# Patient Record
Sex: Male | Born: 1960 | Race: White | Hispanic: No | Smoking: Former smoker
Health system: Southern US, Community
[De-identification: ages and names within clinical notes are randomized; demographics above are authoritative.]

## PROBLEM LIST (undated history)

## (undated) DIAGNOSIS — I10 Essential (primary) hypertension: Secondary | ICD-10-CM

## (undated) DIAGNOSIS — E119 Type 2 diabetes mellitus without complications: Secondary | ICD-10-CM

## (undated) HISTORY — PX: NO PAST SURGERIES: SHX2092

---

## 2012-12-13 ENCOUNTER — Ambulatory Visit: Payer: Self-pay | Admitting: Orthopedic Surgery

## 2012-12-20 ENCOUNTER — Other Ambulatory Visit: Payer: Self-pay | Admitting: Neurosurgery

## 2012-12-24 ENCOUNTER — Encounter (HOSPITAL_COMMUNITY)
Admission: RE | Admit: 2012-12-24 | Discharge: 2012-12-24 | Disposition: A | Discharge status: Home / Self Care | Payer: 59 | Source: Ambulatory Visit | Attending: Anesthesiology | Admitting: Anesthesiology

## 2012-12-24 ENCOUNTER — Encounter (HOSPITAL_COMMUNITY)
Admission: RE | Admit: 2012-12-24 | Discharge: 2012-12-24 | Disposition: A | Discharge status: Home / Self Care | Payer: 59 | Source: Ambulatory Visit | Attending: Neurosurgery | Admitting: Neurosurgery

## 2012-12-24 ENCOUNTER — Encounter (HOSPITAL_COMMUNITY): Payer: Self-pay

## 2012-12-24 DIAGNOSIS — E119 Type 2 diabetes mellitus without complications: Secondary | ICD-10-CM | POA: Insufficient documentation

## 2012-12-24 DIAGNOSIS — Z01818 Encounter for other preprocedural examination: Secondary | ICD-10-CM | POA: Insufficient documentation

## 2012-12-24 DIAGNOSIS — I10 Essential (primary) hypertension: Secondary | ICD-10-CM | POA: Insufficient documentation

## 2012-12-24 DIAGNOSIS — Z01812 Encounter for preprocedural laboratory examination: Secondary | ICD-10-CM | POA: Insufficient documentation

## 2012-12-24 DIAGNOSIS — Z01810 Encounter for preprocedural cardiovascular examination: Secondary | ICD-10-CM | POA: Insufficient documentation

## 2012-12-24 HISTORY — DX: Essential (primary) hypertension: I10

## 2012-12-24 HISTORY — DX: Type 2 diabetes mellitus without complications: E11.9

## 2012-12-24 LAB — CBC
HCT: 45.8 % (ref 39.0–52.0)
Hemoglobin: 17 g/dL (ref 13.0–17.0)
MCHC: 37.1 g/dL — ABNORMAL HIGH (ref 30.0–36.0)
MCV: 84.2 fL (ref 78.0–100.0)
RDW: 13.4 % (ref 11.5–15.5)
WBC: 12.8 10*3/uL — ABNORMAL HIGH (ref 4.0–10.5)

## 2012-12-24 LAB — BASIC METABOLIC PANEL
BUN: 12 mg/dL (ref 6–23)
CO2: 28 mEq/L (ref 19–32)
Chloride: 96 mEq/L (ref 96–112)
Creatinine, Ser: 0.61 mg/dL (ref 0.50–1.35)
Potassium: 4 mEq/L (ref 3.5–5.1)

## 2012-12-24 MED ORDER — DEXTROSE 5 % IV SOLN: 2.0000 g | INTRAVENOUS | Status: DC

## 2012-12-24 MED ORDER — DEXTROSE 5 % IV SOLN: 2.0000 g | INTRAVENOUS | Status: AC

## 2012-12-24 NOTE — Pre-Procedure Instructions (Addendum)
Jonathan Henson  12/24/2012   Your procedure is scheduled on:  Wednesday, May 7th.  Report to [facility] at 6:30 PM.  Call this number if you have problems the morning of surgery: [phone]   Remember:   Do not eat food or drink liquids after midnight.   Take these medicines the morning of surgery with A SIP OF WATER: - None   Do not wear jewelry, make-up or nail polish.  Do not wear lotions, powders, or perfumes. You may wear deodorant.   Men may shave face and neck.  Do not bring valuables to the hospital.  Contacts, dentures or bridgework may not be worn into surgery.  Leave suitcase in the car. After surgery it may be brought to your room.  For patients admitted to the hospital, checkout time is 11:00 AM the day of discharge.   Patients discharged the day of surgery will not be allowed to drive home.  Name and phone number of your driver: -   Special Instructions: Shower using CHG 2 nights before surgery and the night before surgery.  If you shower the day of surgery use CHG.  Use special wash - you have one bottle of CHG for all showers.  You should use approximately 1/3 of the bottle for each shower.   Please read over the following fact sheets that you were given: Pain Booklet, Coughing and Deep Breathing and Surgical Site Infection Prevention

## 2012-12-24 NOTE — Pre-Procedure Instructions (Addendum)
Jonathan Henson  12/24/2012   Your procedure is scheduled on:  Friday, May 9th.  Report to [facility] at 12:30 PM.  Call this number if you have problems the morning of surgery: [phone]   Remember:   Do not eat food or drink liquids after midnight.   Take these medicines the morning of surgery with A SIP OF WATER: -   Do not wear jewelry, make-up or nail polish.  Do not wear lotions, powders, or perfumes. You may wear deodorant.  Do not shave 48 hours prior to surgery.   Do not bring valuables to the hospital.  Contacts, dentures or bridgework may not be worn into surgery.  Leave suitcase in the car. After surgery it may be brought to your room.  For patients admitted to the hospital, checkout time is 11:00 AM the day of  discharge.   Patients discharged the day of surgery will not be allowed to drive  home.  Name and phone number of your driver: -   Special Instructions:   Please read over the following fact sheets that you were given: Pain Booklet, Coughing and Deep Breathing and Surgical Site Infection Prevention

## 2012-12-25 ENCOUNTER — Ambulatory Visit: Admission: RE | Admit: 2012-12-25 | Payer: 59 | Source: Ambulatory Visit | Admitting: Neurosurgery

## 2012-12-25 SURGERY — ANTERIOR CERVICAL DECOMPRESSION/DISCECTOMY FUSION 1 LEVEL
Anesthesia: General

## 2012-12-30 ENCOUNTER — Other Ambulatory Visit: Payer: Self-pay | Admitting: Neurosurgery

## 2013-01-07 MED ORDER — DEXAMETHASONE SODIUM PHOSPHATE 10 MG/ML IJ SOLN
10.0000 mg | INTRAMUSCULAR | Status: AC
  Administered 2013-01-08: 10 mg via INTRAVENOUS
  Filled 2013-01-07: qty 1

## 2013-01-07 MED ORDER — MUPIROCIN 2 % EX OINT: TOPICAL_OINTMENT | Freq: Two times a day (BID) | CUTANEOUS | Status: DC

## 2013-01-08 ENCOUNTER — Ambulatory Visit (HOSPITAL_COMMUNITY): Payer: 59 | Admitting: Anesthesiology

## 2013-01-08 ENCOUNTER — Ambulatory Visit (HOSPITAL_COMMUNITY): Payer: 59

## 2013-01-08 ENCOUNTER — Encounter (HOSPITAL_COMMUNITY): Payer: Self-pay | Admitting: Anesthesiology

## 2013-01-08 ENCOUNTER — Encounter (HOSPITAL_COMMUNITY)
Admission: RE | Disposition: A | Discharge status: Home / Self Care | Payer: Self-pay | Source: Ambulatory Visit | Attending: Neurosurgery

## 2013-01-08 ENCOUNTER — Ambulatory Visit (HOSPITAL_COMMUNITY)
Admission: RE | Admit: 2013-01-08 | Discharge: 2013-01-09 | Disposition: A | Discharge status: Home / Self Care | Payer: 59 | Source: Ambulatory Visit | Attending: Neurosurgery | Admitting: Neurosurgery

## 2013-01-08 DIAGNOSIS — I10 Essential (primary) hypertension: Secondary | ICD-10-CM | POA: Insufficient documentation

## 2013-01-08 DIAGNOSIS — E119 Type 2 diabetes mellitus without complications: Secondary | ICD-10-CM | POA: Insufficient documentation

## 2013-01-08 DIAGNOSIS — M5020 Other cervical disc displacement, unspecified cervical region: Secondary | ICD-10-CM | POA: Insufficient documentation

## 2013-01-08 HISTORY — PX: ANTERIOR CERVICAL DECOMP/DISCECTOMY FUSION: SHX1161

## 2013-01-08 LAB — CBC
Hemoglobin: 18.1 g/dL — ABNORMAL HIGH (ref 13.0–17.0)
MCV: 86.7 fL (ref 78.0–100.0)
Platelets: 196 10*3/uL (ref 150–400)
RBC: 5.65 MIL/uL (ref 4.22–5.81)
WBC: 8.6 10*3/uL (ref 4.0–10.5)

## 2013-01-08 LAB — BASIC METABOLIC PANEL
CO2: 26 mEq/L (ref 19–32)
Chloride: 96 mEq/L (ref 96–112)
Creatinine, Ser: 0.71 mg/dL (ref 0.50–1.35)
Glucose, Bld: 195 mg/dL — ABNORMAL HIGH (ref 70–99)
Sodium: 137 mEq/L (ref 135–145)

## 2013-01-08 LAB — GLUCOSE, CAPILLARY
Glucose-Capillary: 170 mg/dL — ABNORMAL HIGH (ref 70–99)
Glucose-Capillary: 203 mg/dL — ABNORMAL HIGH (ref 70–99)
Glucose-Capillary: 274 mg/dL — ABNORMAL HIGH (ref 70–99)

## 2013-01-08 SURGERY — ANTERIOR CERVICAL DECOMPRESSION/DISCECTOMY FUSION 1 LEVEL
Anesthesia: General | Site: Neck | Wound class: Clean

## 2013-01-08 MED ORDER — MENTHOL 3 MG MT LOZG: 1.0000 | LOZENGE | OROMUCOSAL | Status: DC | PRN

## 2013-01-08 MED ORDER — THROMBIN 5000 UNITS EX SOLR
CUTANEOUS | Status: DC | PRN
  Administered 2013-01-08 (×3): 5000 [IU] via TOPICAL

## 2013-01-08 MED ORDER — PHENYLEPHRINE HCL 10 MG/ML IJ SOLN
INTRAMUSCULAR | Status: DC | PRN
  Administered 2013-01-08 (×7): 80 ug via INTRAVENOUS

## 2013-01-08 MED ORDER — MIDAZOLAM HCL 5 MG/5ML IJ SOLN
INTRAMUSCULAR | Status: DC | PRN
  Administered 2013-01-08: 2 mg via INTRAVENOUS

## 2013-01-08 MED ORDER — LIDOCAINE HCL 4 % MT SOLN
OROMUCOSAL | Status: DC | PRN
  Administered 2013-01-08: 4 mL via TOPICAL

## 2013-01-08 MED ORDER — SODIUM CHLORIDE 0.9 % IJ SOLN
3.0000 mL | Freq: Two times a day (BID) | INTRAMUSCULAR | Status: DC
  Administered 2013-01-08: 3 mL via INTRAVENOUS

## 2013-01-08 MED ORDER — CEFAZOLIN SODIUM 1-5 GM-% IV SOLN
1.0000 g | Freq: Three times a day (TID) | INTRAVENOUS | Status: AC
  Administered 2013-01-08 – 2013-01-09 (×2): 1 g via INTRAVENOUS
  Filled 2013-01-08 (×2): qty 50

## 2013-01-08 MED ORDER — SODIUM CHLORIDE 0.9 % IJ SOLN: 3.0000 mL | INTRAMUSCULAR | Status: DC | PRN

## 2013-01-08 MED ORDER — PHENYLEPHRINE HCL 10 MG/ML IJ SOLN
10.0000 mg | INTRAVENOUS | Status: DC | PRN
  Administered 2013-01-08: 25 ug/min via INTRAVENOUS

## 2013-01-08 MED ORDER — LISINOPRIL 20 MG PO TABS
20.0000 mg | ORAL_TABLET | Freq: Every day | ORAL | Status: DC
  Administered 2013-01-08: 20 mg via ORAL
  Filled 2013-01-08 (×2): qty 1

## 2013-01-08 MED ORDER — PROPOFOL 10 MG/ML IV BOLUS
INTRAVENOUS | Status: DC | PRN
  Administered 2013-01-08: 190 mg via INTRAVENOUS

## 2013-01-08 MED ORDER — GLYCOPYRROLATE 0.2 MG/ML IJ SOLN
INTRAMUSCULAR | Status: DC | PRN
  Administered 2013-01-08: 0.4 mg via INTRAVENOUS

## 2013-01-08 MED ORDER — NEOSTIGMINE METHYLSULFATE 1 MG/ML IJ SOLN
INTRAMUSCULAR | Status: DC | PRN
  Administered 2013-01-08: 3 mg via INTRAVENOUS

## 2013-01-08 MED ORDER — ONDANSETRON HCL 4 MG/2ML IJ SOLN: 4.0000 mg | INTRAMUSCULAR | Status: DC | PRN

## 2013-01-08 MED ORDER — DEXAMETHASONE SODIUM PHOSPHATE 10 MG/ML IJ SOLN: 10.0000 mg | INTRAMUSCULAR | Status: DC

## 2013-01-08 MED ORDER — PHENOL 1.4 % MT LIQD
1.0000 | OROMUCOSAL | Status: DC | PRN
  Administered 2013-01-08: 1 via OROMUCOSAL
  Filled 2013-01-08: qty 177

## 2013-01-08 MED ORDER — OXYCODONE-ACETAMINOPHEN 5-325 MG PO TABS
1.0000 | ORAL_TABLET | ORAL | Status: DC | PRN
  Administered 2013-01-08: 2 via ORAL
  Administered 2013-01-08 (×2): 1 via ORAL
  Administered 2013-01-09: 2 via ORAL
  Filled 2013-01-08 (×2): qty 2

## 2013-01-08 MED ORDER — FENTANYL CITRATE 0.05 MG/ML IJ SOLN
INTRAMUSCULAR | Status: DC | PRN
  Administered 2013-01-08: 50 ug via INTRAVENOUS
  Administered 2013-01-08: 100 ug via INTRAVENOUS
  Administered 2013-01-08 (×2): 50 ug via INTRAVENOUS

## 2013-01-08 MED ORDER — HYDROMORPHONE HCL PF 1 MG/ML IJ SOLN
0.2500 mg | INTRAMUSCULAR | Status: DC | PRN
  Administered 2013-01-08: 0.5 mg via INTRAVENOUS

## 2013-01-08 MED ORDER — LACTATED RINGERS IV SOLN
INTRAVENOUS | Status: DC | PRN
  Administered 2013-01-08 (×2): via INTRAVENOUS

## 2013-01-08 MED ORDER — ACETAMINOPHEN 650 MG RE SUPP: 650.0000 mg | RECTAL | Status: DC | PRN

## 2013-01-08 MED ORDER — HEMOSTATIC AGENTS (NO CHARGE) OPTIME
TOPICAL | Status: DC | PRN
  Administered 2013-01-08: 1 via TOPICAL

## 2013-01-08 MED ORDER — SODIUM CHLORIDE 0.9 % IR SOLN
Status: DC | PRN
  Administered 2013-01-08: 13:00:00

## 2013-01-08 MED ORDER — OXYCODONE-ACETAMINOPHEN 5-325 MG PO TABS
ORAL_TABLET | ORAL | Status: AC
  Filled 2013-01-08: qty 2

## 2013-01-08 MED ORDER — ONDANSETRON HCL 4 MG/2ML IJ SOLN: 4.0000 mg | Freq: Four times a day (QID) | INTRAMUSCULAR | Status: DC | PRN

## 2013-01-08 MED ORDER — CEFAZOLIN SODIUM-DEXTROSE 2-3 GM-% IV SOLR
2.0000 g | INTRAVENOUS | Status: AC
  Administered 2013-01-08: 2 g via INTRAVENOUS

## 2013-01-08 MED ORDER — ROCURONIUM BROMIDE 100 MG/10ML IV SOLN
INTRAVENOUS | Status: DC | PRN
  Administered 2013-01-08: 50 mg via INTRAVENOUS

## 2013-01-08 MED ORDER — GLIMEPIRIDE 1 MG PO TABS
1.0000 mg | ORAL_TABLET | Freq: Every day | ORAL | Status: DC
  Administered 2013-01-09: 1 mg via ORAL
  Filled 2013-01-08 (×2): qty 1

## 2013-01-08 MED ORDER — LIDOCAINE HCL (CARDIAC) 20 MG/ML IV SOLN
INTRAVENOUS | Status: DC | PRN
  Administered 2013-01-08: 60 mg via INTRAVENOUS

## 2013-01-08 MED ORDER — ESMOLOL HCL 10 MG/ML IV SOLN
INTRAVENOUS | Status: DC | PRN
  Administered 2013-01-08: 20 mg via INTRAVENOUS

## 2013-01-08 MED ORDER — SODIUM CHLORIDE 0.9 % IV SOLN
INTRAVENOUS | Status: AC
  Filled 2013-01-08: qty 500

## 2013-01-08 MED ORDER — BACITRACIN 50000 UNITS IM SOLR
INTRAMUSCULAR | Status: AC
  Filled 2013-01-08: qty 1

## 2013-01-08 MED ORDER — 0.9 % SODIUM CHLORIDE (POUR BTL) OPTIME
TOPICAL | Status: DC | PRN
  Administered 2013-01-08: 1000 mL

## 2013-01-08 MED ORDER — CYCLOBENZAPRINE HCL 10 MG PO TABS
ORAL_TABLET | ORAL | Status: AC
  Filled 2013-01-08: qty 1

## 2013-01-08 MED ORDER — HYDROMORPHONE HCL PF 1 MG/ML IJ SOLN
INTRAMUSCULAR | Status: AC
  Filled 2013-01-08: qty 1

## 2013-01-08 MED ORDER — METFORMIN HCL 500 MG PO TABS
1000.0000 mg | ORAL_TABLET | Freq: Every day | ORAL | Status: DC
  Administered 2013-01-08: 1000 mg via ORAL
  Filled 2013-01-08 (×2): qty 2

## 2013-01-08 MED ORDER — ONDANSETRON HCL 4 MG/2ML IJ SOLN
INTRAMUSCULAR | Status: DC | PRN
  Administered 2013-01-08: 4 mg via INTRAVENOUS

## 2013-01-08 MED ORDER — ACETAMINOPHEN 325 MG PO TABS: 650.0000 mg | ORAL_TABLET | ORAL | Status: DC | PRN

## 2013-01-08 MED ORDER — METFORMIN HCL 500 MG PO TABS
1000.0000 mg | ORAL_TABLET | Freq: Every day | ORAL | Status: DC
  Filled 2013-01-08: qty 2

## 2013-01-08 MED ORDER — STERILE WATER FOR IRRIGATION IR SOLN
Status: DC | PRN
  Administered 2013-01-08: 1000 mL

## 2013-01-08 MED ORDER — CEFAZOLIN SODIUM-DEXTROSE 2-3 GM-% IV SOLR
INTRAVENOUS | Status: AC
  Filled 2013-01-08: qty 50

## 2013-01-08 MED ORDER — OXYCODONE HCL 5 MG PO TABS: 5.0000 mg | ORAL_TABLET | Freq: Once | ORAL | Status: DC | PRN

## 2013-01-08 MED ORDER — CYCLOBENZAPRINE HCL 10 MG PO TABS
10.0000 mg | ORAL_TABLET | Freq: Three times a day (TID) | ORAL | Status: DC | PRN
  Administered 2013-01-08: 10 mg via ORAL

## 2013-01-08 MED ORDER — HYDROMORPHONE HCL PF 1 MG/ML IJ SOLN: 0.5000 mg | INTRAMUSCULAR | Status: DC | PRN

## 2013-01-08 MED ORDER — OXYCODONE HCL 5 MG/5ML PO SOLN: 5.0000 mg | Freq: Once | ORAL | Status: DC | PRN

## 2013-01-08 MED ORDER — ACETAMINOPHEN 500 MG PO TABS: 500.0000 mg | ORAL_TABLET | Freq: Four times a day (QID) | ORAL | Status: DC | PRN

## 2013-01-08 SURGICAL SUPPLY — 54 items
BAG DECANTER FOR FLEXI CONT (MISCELLANEOUS) ×2 IMPLANT
BENZOIN TINCTURE PRP APPL 2/3 (GAUZE/BANDAGES/DRESSINGS) ×2 IMPLANT
BRUSH SCRUB EZ PLAIN DRY (MISCELLANEOUS) ×2 IMPLANT
BUR MATCHSTICK NEURO 3.0 LAGG (BURR) ×2 IMPLANT
CANISTER SUCTION 2500CC (MISCELLANEOUS) ×2 IMPLANT
CLOTH BEACON ORANGE TIMEOUT ST (SAFETY) ×2 IMPLANT
CONT SPEC 4OZ CLIKSEAL STRL BL (MISCELLANEOUS) ×2 IMPLANT
DERMABOND ADVANCED (GAUZE/BANDAGES/DRESSINGS) ×1
DERMABOND ADVANCED .7 DNX12 (GAUZE/BANDAGES/DRESSINGS) ×1 IMPLANT
DRAPE C-ARM 42X72 X-RAY (DRAPES) ×4 IMPLANT
DRAPE LAPAROTOMY 100X72 PEDS (DRAPES) ×2 IMPLANT
DRAPE MICROSCOPE ZEISS OPMI (DRAPES) ×2 IMPLANT
DRAPE POUCH INSTRU U-SHP 10X18 (DRAPES) ×2 IMPLANT
DRSG OPSITE 4X5.5 SM (GAUZE/BANDAGES/DRESSINGS) ×2 IMPLANT
DURAPREP 6ML APPLICATOR 50/CS (WOUND CARE) ×2 IMPLANT
ELECT COATED BLADE 2.86 ST (ELECTRODE) ×2 IMPLANT
ELECT REM PT RETURN 9FT ADLT (ELECTROSURGICAL) ×2
ELECTRODE REM PT RTRN 9FT ADLT (ELECTROSURGICAL) ×1 IMPLANT
GAUZE SPONGE 4X4 16PLY XRAY LF (GAUZE/BANDAGES/DRESSINGS) IMPLANT
GLOVE BIO SURGEON STRL SZ8 (GLOVE) ×2 IMPLANT
GLOVE EXAM NITRILE LRG STRL (GLOVE) ×2 IMPLANT
GLOVE EXAM NITRILE MD LF STRL (GLOVE) IMPLANT
GLOVE EXAM NITRILE XL STR (GLOVE) IMPLANT
GLOVE EXAM NITRILE XS STR PU (GLOVE) IMPLANT
GLOVE INDICATOR 8.5 STRL (GLOVE) ×2 IMPLANT
GOWN BRE IMP SLV AUR LG STRL (GOWN DISPOSABLE) ×2 IMPLANT
GOWN BRE IMP SLV AUR XL STRL (GOWN DISPOSABLE) ×2 IMPLANT
GOWN STRL REIN 2XL LVL4 (GOWN DISPOSABLE) ×2 IMPLANT
HEAD HALTER (SOFTGOODS) ×2 IMPLANT
HEMOSTAT POWDER KIT SURGIFOAM (HEMOSTASIS) ×2 IMPLANT
KIT BASIN OR (CUSTOM PROCEDURE TRAY) ×2 IMPLANT
KIT ROOM TURNOVER OR (KITS) ×2 IMPLANT
NEEDLE HYPO 18GX1.5 BLUNT FILL (NEEDLE) ×2 IMPLANT
NEEDLE SPNL 20GX3.5 QUINCKE YW (NEEDLE) ×2 IMPLANT
NS IRRIG 1000ML POUR BTL (IV SOLUTION) ×2 IMPLANT
PACK LAMINECTOMY NEURO (CUSTOM PROCEDURE TRAY) ×2 IMPLANT
PAD ARMBOARD 7.5X6 YLW CONV (MISCELLANEOUS) ×6 IMPLANT
PLATE ANT CERV XTEND 1 LV 14 (Plate) ×2 IMPLANT
PUTTY BONE DBX 2.5 MIS (Bone Implant) ×2 IMPLANT
RUBBERBAND STERILE (MISCELLANEOUS) ×4 IMPLANT
SCREW XTD VAR 4.2 SELF TAP (Screw) ×8 IMPLANT
SPACER COLONIAL LGE 8MM 7DEG (Spacer) ×2 IMPLANT
SPONGE GAUZE 4X4 12PLY (GAUZE/BANDAGES/DRESSINGS) ×2 IMPLANT
SPONGE INTESTINAL PEANUT (DISPOSABLE) ×2 IMPLANT
SPONGE SURGIFOAM ABS GEL SZ50 (HEMOSTASIS) ×2 IMPLANT
STRIP CLOSURE SKIN 1/2X4 (GAUZE/BANDAGES/DRESSINGS) ×2 IMPLANT
SUT VIC AB 3-0 SH 8-18 (SUTURE) ×2 IMPLANT
SUT VICRYL 4-0 PS2 18IN ABS (SUTURE) ×2 IMPLANT
SYR 20ML ECCENTRIC (SYRINGE) ×2 IMPLANT
TAPE CLOTH 4X10 WHT NS (GAUZE/BANDAGES/DRESSINGS) IMPLANT
TOWEL OR 17X24 6PK STRL BLUE (TOWEL DISPOSABLE) ×2 IMPLANT
TOWEL OR 17X26 10 PK STRL BLUE (TOWEL DISPOSABLE) ×2 IMPLANT
TRAP SPECIMEN MUCOUS 40CC (MISCELLANEOUS) ×2 IMPLANT
WATER STERILE IRR 1000ML POUR (IV SOLUTION) ×2 IMPLANT

## 2013-01-08 NOTE — Anesthesia Preprocedure Evaluation (Addendum)
Anesthesia Evaluation  Patient identified by MRN, date of birth, ID band Patient awake    Reviewed: Allergy & Precautions, H&P , NPO status , Patient's Chart, lab work & pertinent test results  Airway Mallampati: II  Neck ROM: full    Dental  (+) Teeth Intact   Pulmonary former smoker,          Cardiovascular hypertension,     Neuro/Psych    GI/Hepatic   Endo/Other  diabetes, Type 2  Renal/GU      Musculoskeletal   Abdominal   Peds  Hematology   Anesthesia Other Findings   Reproductive/Obstetrics                          Anesthesia Physical Anesthesia Plan  ASA: II  Anesthesia Plan: General   Post-op Pain Management:    Induction: Intravenous  Airway Management Planned: Oral ETT  Additional Equipment:   Intra-op Plan:   Post-operative Plan: Extubation in OR  Informed Consent: I have reviewed the patients History and Physical, chart, labs and discussed the procedure including the risks, benefits and alternatives for the proposed anesthesia with the patient or authorized representative who has indicated his/her understanding and acceptance.     Plan Discussed with: CRNA, Anesthesiologist and Surgeon  Anesthesia Plan Comments:         Anesthesia Quick Evaluation

## 2013-01-08 NOTE — Preoperative (Signed)
Beta Blockers   Reason not to administer Beta Blockers:Not Applicable 

## 2013-01-08 NOTE — Anesthesia Postprocedure Evaluation (Signed)
Anesthesia Post Note  Patient: Jonathan Henson  Procedure(s) Performed: Procedure(s) (LRB): ANTERIOR CERVICAL DECOMPRESSION/DISCECTOMY FUSION 1 LEVEL Cervical four-five (N/A)  Anesthesia type: General  Patient location: PACU  Post pain: Pain level controlled and Adequate analgesia  Post assessment: Post-op Vital signs reviewed, Patient's Cardiovascular Status Stable, Respiratory Function Stable, Patent Airway and Pain level controlled  Last Vitals:  Filed Vitals:   01/08/13 1428  BP:   Pulse: 79  Temp:   Resp: 15    Post vital signs: Reviewed and stable  Level of consciousness: awake, alert  and oriented  Complications: No apparent anesthesia complications

## 2013-01-08 NOTE — Plan of Care (Signed)
Problem: Consults Goal: Diagnosis - Spinal Surgery Outcome: Completed/Met Date Met:  01/08/13 Cervical Spine Fusion

## 2013-01-08 NOTE — Anesthesia Procedure Notes (Signed)
Procedure Name: Intubation Date/Time: 01/08/2013 12:20 PM Performed by: Sharlene Dory E Pre-anesthesia Checklist: Patient identified, Emergency Drugs available, Suction available, Patient being monitored and Timeout performed Patient Re-evaluated:Patient Re-evaluated prior to inductionOxygen Delivery Method: Circle system utilized Preoxygenation: Pre-oxygenation with 100% oxygen Intubation Type: IV induction Ventilation: Mask ventilation without difficulty Laryngoscope Size: Mac and 3 Grade View: Grade II Tube type: Oral Tube size: 7.5 mm Number of attempts: 1 Airway Equipment and Method: Stylet,  C-Track utilized and LTA kit utilized Placement Confirmation: ETT inserted through vocal cords under direct vision,  positive ETCO2 and breath sounds checked- equal and bilateral Secured at: 22 cm Tube secured with: Tape Dental Injury: Teeth and Oropharynx as per pre-operative assessment

## 2013-01-08 NOTE — Transfer of Care (Signed)
Immediate Anesthesia Transfer of Care Note  Patient: Jonathan Henson  Procedure(s) Performed: Procedure(s): ANTERIOR CERVICAL DECOMPRESSION/DISCECTOMY FUSION 1 LEVEL Cervical four-five (N/A)  Patient Location: PACU  Anesthesia Type:General  Level of Consciousness: awake, alert  and oriented  Airway & Oxygen Therapy: Patient Spontanous Breathing and Patient connected to nasal cannula oxygen  Post-op Assessment: Report given to PACU RN, Post -op Vital signs reviewed and stable and Patient moving all extremities X 4  Post vital signs: Reviewed and stable  Complications: No apparent anesthesia complications

## 2013-01-08 NOTE — Op Note (Signed)
Preoperative diagnosis: Right C5 radiculopathy from herniated nucleus pulposus C4-5 and spinal cord injury  Postoperative diagnosis: Same  Procedure: Anterior cervical discectomy and fusion at C4-5 using globus peek cages packed with local autograft mixed with DBX and globus extend plating system 14 mm plate with 1-61 mm variable angle screws  Surgeon: Jillyn Hidden Tabithia Stroder  Assistant: Coletta Memos  Anesthesia: Gen.  EBL: Minimal  History of present illness: Patient is a very pleasant 52 year old on who's had probably worsening neck pain right shoulder pain with weakness in his right shoulder and right tricep. Workup revealed a large disc herniation at C4-5 on the right causing severe stenosis of the right C5 neural foramen as well as a spinal cord contusion. Due to patient's clinical exam and imaging findings and failure conservative treatment I recommended anterior cervical discectomy fusion at C4-5 I extensively reviewed the risks and benefits of the operation as well as perioperative course expectations of outcome alternatives of surgery he understands and agrees to proceed forward.  Operative procedure: Patient brought into the or was induced under general anesthesia positioned supine the neck in slight extension in 5 pounds of halter traction the right side his neck was prepped and draped in routine sterile fashion. Preoperative x-ray localize the appropriate level so after adequate prepping curvilinear incision was made just off midline to the interbody the sternomastoid and the superficial layer of the platysma was dissected and divided longitudinally the avascular plane to sternomastoid and strap as was developed and the previously fashioned professes acid with Kitners. Interoperative X. identify the appropriate level so annulotomy was made with a 15 blade marked the disc space and self-retaining retractors placed. The spaces further incised large anterior aspect of did not a 3 minute Kerrison punch BA  curette used to scrape the remainder the disc space out and the space is drilled down the posterior annulus and a complex using a high-speed drill capturing the bone shavings in a mucous trap the under microscopic illumination the undersurface of both endplates was aggressively under bitten the PLL was identified and removed in piecemeal fashion exposing the thecal sac there was several large hoarseness migrated subligamentous and causing severe thecal sac compression and stenosis the proximal C5 nerve root. These were teased away with I nerve hook and marking laterally both C5 pedicles were identified both C5 nerve root skeletonized decompressed. There was extensive amount of fibrosis and osteophyte causing severe stenosis of the right C5 nerve root as well as all the disc fragments were after all this is removed there is no further stenosis either foraminally or centrally the endplates were scraped and prepared into the cage the cage was inserted then a 40 mm placed all screws excellent purchase locking mechanisms were engaged additional bone graft was packed laterally on the left and the wounds closed in layers with after Vicryl and the skin was closed with a running 4 subcuticular benzoin and Steri-Strips were applied patient recovered in stable condition. At the end of case all needle counts sponge counts were correct.

## 2013-01-08 NOTE — H&P (Signed)
Jonathan Henson is an 52 y.o. male.   Chief Complaint: Neck and right shoulder and arm pain weakness HPI: Patient is a 52 year old some is a progress worsening neck pain within the pain in the right shoulder with weakness in his right shoulder has got progressively worse imaging showed cervical spondylosis with spinal cord injury with signal change within the spinal cord as well as a large disc herniation displacing the right C5 nerve root. Due to his progression of clinical syndrome weakness an exam and imaging findings I recommended anterior cervical discectomy fusion at C4-5 went over the risks and benefits of the operation with him as well as perioperative course expectations of outcome alternatives of surgery he understands and agrees to proceed forward.  Past Medical History  Diagnosis Date  . Diabetes mellitus without complication   . Hypertension     Past Surgical History  Procedure Laterality Date  . No past surgeries      No family history on file. Social History:  reports that he has quit smoking. His smoking use included Cigarettes. He smoked 0.00 packs per day for 5 years. His smokeless tobacco use includes Snuff. He reports that he does not drink alcohol or use illicit drugs.  Allergies: No Known Allergies  Medications Prior to Admission  Medication Sig Dispense Refill  . acetaminophen (TYLENOL) 500 MG tablet Take 500 mg by mouth every 6 (six) hours as needed for pain.      Marland Kitchen glimepiride (AMARYL) 1 MG tablet Take 1 mg by mouth daily before breakfast.      . lisinopril (PRINIVIL,ZESTRIL) 20 MG tablet Take 20 mg by mouth daily.      . metFORMIN (GLUCOPHAGE) 500 MG tablet Take 1,000 mg by mouth daily.        Results for orders placed during the hospital encounter of 01/08/13 (from the past 48 hour(s))  CBC     Status: Abnormal   Collection Time    01/08/13 11:03 AM      Result Value Range   WBC 8.6  4.0 - 10.5 K/uL   RBC 5.65  4.22 - 5.81 MIL/uL   Hemoglobin 18.1 (*) 13.0  - 17.0 g/dL   HCT 16.1  09.6 - 04.5 %   MCV 86.7  78.0 - 100.0 fL   MCH 32.0  26.0 - 34.0 pg   MCHC 36.9 (*) 30.0 - 36.0 g/dL   RDW 40.9  81.1 - 91.4 %   Platelets 196  150 - 400 K/uL  BASIC METABOLIC PANEL     Status: Abnormal   Collection Time    01/08/13 11:03 AM      Result Value Range   Sodium 137  135 - 145 mEq/L   Potassium 4.3  3.5 - 5.1 mEq/L   Chloride 96  96 - 112 mEq/L   CO2 26  19 - 32 mEq/L   Glucose, Bld 195 (*) 70 - 99 mg/dL   BUN 11  6 - 23 mg/dL   Creatinine, Ser 7.82  0.50 - 1.35 mg/dL   Calcium 9.9  8.4 - 95.6 mg/dL   GFR calc non Af Amer >90  >90 mL/min   GFR calc Af Amer >90  >90 mL/min   Comment:            The eGFR has been calculated     using the CKD EPI equation.     This calculation has not been     validated in all clinical  situations.     eGFR's persistently     <90 mL/min signify     possible Chronic Kidney Disease.  GLUCOSE, CAPILLARY     Status: Abnormal   Collection Time    01/08/13 11:07 AM      Result Value Range   Glucose-Capillary 170 (*) 70 - 99 mg/dL   No results found.  Review of Systems  Constitutional: Negative.   HENT: Positive for neck pain.   Eyes: Negative.   Respiratory: Negative.   Cardiovascular: Negative.   Gastrointestinal: Negative.   Genitourinary: Negative.   Musculoskeletal: Positive for myalgias.  Skin: Negative.   Neurological: Positive for tingling and sensory change.  Endo/Heme/Allergies: Negative.   Psychiatric/Behavioral: Negative.     Blood pressure 116/76, pulse 101, temperature 97.4 F (36.3 C), temperature source Oral, resp. rate 20, SpO2 97.00%. Physical Exam  Constitutional: He is oriented to person, place, and time. He appears well-developed and well-nourished.  Eyes: Pupils are equal, round, and reactive to light.  Neck: Normal range of motion.  Respiratory: Effort normal.  GI: Soft.  Neurological: He is alert and oriented to person, place, and time. GCS eye subscore is 4. GCS verbal  subscore is 5. GCS motor subscore is 6.  Reflex Scores:      Brachioradialis reflexes are 1+ on the right side and 1+ on the left side.      Patellar reflexes are 2+ on the right side and 2+ on the left side.      Achilles reflexes are 2+ on the right side and 2+ on the left side. Left upper x-rays strength is 5 out of 5 in his deltoids biceps triceps wrist flexion extension and intrinsics right upper to me strength has very weak deltoid and about 2-3/5 biceps about 4 or 5 otherwise 5 out of 5     Assessment/Plan Patient presents for an ACDF at C4-5  Jonathan Henson P 01/08/2013, 11:52 AM

## 2013-01-09 ENCOUNTER — Encounter (HOSPITAL_COMMUNITY): Payer: Self-pay | Admitting: Neurosurgery

## 2013-01-09 LAB — GLUCOSE, CAPILLARY: Glucose-Capillary: 151 mg/dL — ABNORMAL HIGH (ref 70–99)

## 2013-01-09 MED ORDER — OXYCODONE-ACETAMINOPHEN 5-325 MG PO TABS: 1.0000 | ORAL_TABLET | ORAL | Status: AC | PRN

## 2013-01-09 MED ORDER — CYCLOBENZAPRINE HCL 10 MG PO TABS: 10.0000 mg | ORAL_TABLET | Freq: Three times a day (TID) | ORAL | Status: AC | PRN

## 2013-01-09 NOTE — Progress Notes (Signed)
Pt. discharged home accompanied by mother. Prescriptions and discharge instructions given with verbalization of understanding. Incision site on neck with no s/s of infection - no swelling, redness, bleeding, and/or drainage noted. Soft collar intact. Opportunity given to ask questions but no question asked. Pt. transported out of this unit in wheelchair by the volunteer.

## 2013-01-09 NOTE — Progress Notes (Signed)
Patient ID: Jonathan Henson, male   DOB: 20-Nov-1960, 52 y.o.   MRN: [id number] Patient is doing very well has a complete resolution of his preoperative right arm radicular pain he still has weakness in his right shoulder but is improving. Neurologic exam stable deltoid is still 2-3/5 wound is clean and dry plan discharge home

## 2013-01-09 NOTE — Discharge Summary (Signed)
  Physician Discharge Summary  Patient ID: Jonathan Henson MRN: [id number] DOB/AGE: Dec 23, 1960 52 y.o.  Admit date: 01/08/2013 Discharge date: 01/09/2013  Admission Diagnoses: Right C5 radiculopathy from HNP C4-5 right  Discharge Diagnoses: Same Active Problems:   * No active hospital problems. *   Discharged Condition: good  Hospital Course: Patient was admitted hospital underwent the aforementioned procedure postoperatively patient did very well with recovered in the floor on the floor was convalescing well ambulating voiding and swallowing fine was stable to be discharged home.  Consults: Significant Diagnostic Studies: Treatments: ACDF C4-5 Discharge Exam: Blood pressure 121/81, pulse 93, temperature 98.5 F (36.9 C), temperature source Oral, resp. rate 16, SpO2 95.00%. Strength out of 5 wound clean and dry  Disposition: Home     Medication List    TAKE these medications       acetaminophen 500 MG tablet  Commonly known as:  TYLENOL  Take 500 mg by mouth every 6 (six) hours as needed for pain.     cyclobenzaprine 10 MG tablet  Commonly known as:  FLEXERIL  Take 1 tablet (10 mg total) by mouth 3 (three) times daily as needed for muscle spasms.     glimepiride 1 MG tablet  Commonly known as:  AMARYL  Take 1 mg by mouth daily before breakfast.     lisinopril 20 MG tablet  Commonly known as:  PRINIVIL,ZESTRIL  Take 20 mg by mouth daily.     metFORMIN 500 MG tablet  Commonly known as:  GLUCOPHAGE  Take 1,000 mg by mouth daily.     oxyCODONE-acetaminophen 5-325 MG per tablet  Commonly known as:  PERCOCET/ROXICET  Take 1-2 tablets by mouth every 4 (four) hours as needed.           Follow-up Information   Follow up with Lorella Gomez P, MD.   Contact information:   [address] [place] [address] [phone]       Signed: Tajae Rybicki P 01/09/2013, 7:45 AM

## 2013-06-02 ENCOUNTER — Emergency Department: Payer: Self-pay | Admitting: Emergency Medicine

## 2013-06-02 LAB — COMPREHENSIVE METABOLIC PANEL
Anion Gap: 16 (ref 7–16)
Co2: 20 mmol/L — ABNORMAL LOW (ref 21–32)
EGFR (African American): >60
Glucose: 185 mg/dL — ABNORMAL HIGH (ref 65–99)
Sodium: 137 mmol/L (ref 136–145)
Total Protein: 7.2 g/dL (ref 6.4–8.2)

## 2013-06-02 LAB — CBC WITH DIFFERENTIAL/PLATELET
Basophil #: 0.1 10*3/uL (ref 0.0–0.1)
Eosinophil %: 4 %
HCT: 48.3 % (ref 40.0–52.0)
Lymphocyte #: 4.3 10*3/uL — ABNORMAL HIGH (ref 1.0–3.6)
Lymphocyte %: 41.7 %
MCH: 31.6 pg (ref 26.0–34.0)
MCHC: 34.7 g/dL (ref 32.0–36.0)
MCV: 91 fL (ref 80–100)
Monocyte %: 6.8 %
Neutrophil #: 4.8 10*3/uL (ref 1.4–6.5)
Neutrophil %: 46.7 %
RDW: 13.9 % (ref 11.5–14.5)
WBC: 10.3 10*3/uL (ref 3.8–10.6)

## 2013-06-02 LAB — URINALYSIS, COMPLETE
Bacteria: NONE SEEN
Blood: NEGATIVE
Hyaline Cast: 11
Ketone: NEGATIVE
Leukocyte Esterase: NEGATIVE
Protein: 100
RBC,UR: 1 /HPF (ref 0–5)
Specific Gravity: 1.011 (ref 1.003–1.030)

## 2013-06-02 LAB — DRUG SCREEN, URINE
Barbiturates, Ur Screen: NEGATIVE (ref ?–200)
Opiate, Ur Screen: NEGATIVE (ref ?–300)
Tricyclic, Ur Screen: NEGATIVE (ref ?–1000)

## 2013-10-14 ENCOUNTER — Emergency Department: Payer: Self-pay | Admitting: Emergency Medicine

## 2013-10-14 LAB — CBC
HCT: 47.9 % (ref 40.0–52.0)
HGB: 16.1 g/dL (ref 13.0–18.0)
MCH: 30.3 pg (ref 26.0–34.0)
MCHC: 33.6 g/dL (ref 32.0–36.0)
MCV: 90 fL (ref 80–100)
PLATELETS: 165 10*3/uL (ref 150–440)
RBC: 5.3 10*6/uL (ref 4.40–5.90)
RDW: 13.9 % (ref 11.5–14.5)
WBC: 9.1 10*3/uL (ref 3.8–10.6)

## 2013-10-14 LAB — COMPREHENSIVE METABOLIC PANEL
Albumin: 3.5 g/dL (ref 3.4–5.0)
Alkaline Phosphatase: 56 U/L
Anion Gap: 6 — ABNORMAL LOW (ref 7–16)
BUN: 11 mg/dL (ref 7–18)
Bilirubin,Total: 0.7 mg/dL (ref 0.2–1.0)
Calcium, Total: 8 mg/dL — ABNORMAL LOW (ref 8.5–10.1)
Chloride: 104 mmol/L (ref 98–107)
Co2: 25 mmol/L (ref 21–32)
Creatinine: 0.71 mg/dL (ref 0.60–1.30)
EGFR (African American): >60
Glucose: 166 mg/dL — ABNORMAL HIGH (ref 65–99)
Osmolality: 273 (ref 275–301)
POTASSIUM: 4.5 mmol/L (ref 3.5–5.1)
SGOT(AST): 31 U/L (ref 15–37)
SGPT (ALT): 27 U/L (ref 12–78)
SODIUM: 135 mmol/L — AB (ref 136–145)
TOTAL PROTEIN: 6.8 g/dL (ref 6.4–8.2)

## 2013-10-14 LAB — CK TOTAL AND CKMB (NOT AT ARMC)
CK, Total: 131 U/L
CK-MB: 2.2 ng/mL (ref 0.5–3.6)

## 2013-10-14 LAB — ETHANOL: Ethanol %: <0.003 % (ref 0.000–0.080)

## 2013-11-22 ENCOUNTER — Emergency Department: Payer: Self-pay | Admitting: Emergency Medicine

## 2013-11-22 LAB — URINALYSIS, COMPLETE
BACTERIA: NONE SEEN
Bilirubin,UR: NEGATIVE
Glucose,UR: >=500 mg/dL (ref 0–75)
Ketone: NEGATIVE
Nitrite: NEGATIVE
PH: 6 (ref 4.5–8.0)
Protein: 30
Specific Gravity: 1.008 (ref 1.003–1.030)
Squamous Epithelial: NONE SEEN

## 2013-11-22 LAB — CBC
HCT: 41.8 % (ref 40.0–52.0)
HGB: 14.1 g/dL (ref 13.0–18.0)
MCH: 30.3 pg (ref 26.0–34.0)
MCHC: 33.7 g/dL (ref 32.0–36.0)
MCV: 90 fL (ref 80–100)
Platelet: 206 10*3/uL (ref 150–440)
RBC: 4.64 10*6/uL (ref 4.40–5.90)
RDW: 13.2 % (ref 11.5–14.5)
WBC: 15.6 10*3/uL — AB (ref 3.8–10.6)

## 2013-11-22 LAB — BASIC METABOLIC PANEL
ANION GAP: 4 — AB (ref 7–16)
BUN: 10 mg/dL (ref 7–18)
CALCIUM: 8.4 mg/dL — AB (ref 8.5–10.1)
CREATININE: 0.77 mg/dL (ref 0.60–1.30)
Chloride: 98 mmol/L (ref 98–107)
Co2: 31 mmol/L (ref 21–32)
EGFR (African American): >60
EGFR (Non-African Amer.): >60
GLUCOSE: 269 mg/dL — AB (ref 65–99)
OSMOLALITY: 275 (ref 275–301)
Potassium: 4 mmol/L (ref 3.5–5.1)
SODIUM: 133 mmol/L — AB (ref 136–145)

## 2019-11-06 ENCOUNTER — Ambulatory Visit: Payer: Self-pay

## 2019-12-04 ENCOUNTER — Encounter (HOSPITAL_COMMUNITY): Payer: Self-pay

## 2019-12-04 ENCOUNTER — Emergency Department (HOSPITAL_COMMUNITY): Payer: Self-pay

## 2019-12-04 ENCOUNTER — Emergency Department (HOSPITAL_COMMUNITY)
Admission: EM | Admit: 2019-12-04 | Discharge: 2019-12-04 | Disposition: A | Discharge status: Home / Self Care | Payer: Self-pay | Attending: Emergency Medicine | Admitting: Emergency Medicine

## 2019-12-04 ENCOUNTER — Other Ambulatory Visit: Payer: Self-pay

## 2019-12-04 DIAGNOSIS — Z79899 Other long term (current) drug therapy: Secondary | ICD-10-CM | POA: Insufficient documentation

## 2019-12-04 DIAGNOSIS — F17290 Nicotine dependence, other tobacco product, uncomplicated: Secondary | ICD-10-CM | POA: Insufficient documentation

## 2019-12-04 DIAGNOSIS — Z7984 Long term (current) use of oral hypoglycemic drugs: Secondary | ICD-10-CM | POA: Insufficient documentation

## 2019-12-04 DIAGNOSIS — R194 Change in bowel habit: Secondary | ICD-10-CM | POA: Insufficient documentation

## 2019-12-04 DIAGNOSIS — I10 Essential (primary) hypertension: Secondary | ICD-10-CM | POA: Insufficient documentation

## 2019-12-04 DIAGNOSIS — E119 Type 2 diabetes mellitus without complications: Secondary | ICD-10-CM | POA: Insufficient documentation

## 2019-12-04 LAB — COMPREHENSIVE METABOLIC PANEL
ALT: 38 U/L (ref 0–44)
AST: 18 U/L (ref 15–41)
Albumin: 3.5 g/dL (ref 3.5–5.0)
Alkaline Phosphatase: 72 U/L (ref 38–126)
Anion gap: 11 (ref 5–15)
BUN: 12 mg/dL (ref 6–20)
CO2: 27 mmol/L (ref 22–32)
Calcium: 9 mg/dL (ref 8.9–10.3)
Chloride: 94 mmol/L — ABNORMAL LOW (ref 98–111)
Creatinine, Ser: 0.73 mg/dL (ref 0.61–1.24)
GFR calc Af Amer: >60 mL/min (ref >60)
GFR calc non Af Amer: >60 mL/min (ref >60)
Glucose, Bld: 208 mg/dL — ABNORMAL HIGH (ref 70–99)
Potassium: 3.6 mmol/L (ref 3.5–5.1)
Sodium: 132 mmol/L — ABNORMAL LOW (ref 135–145)
Total Bilirubin: 0.6 mg/dL (ref 0.3–1.2)
Total Protein: 7.1 g/dL (ref 6.5–8.1)

## 2019-12-04 LAB — CBC
HCT: 44 % (ref 39.0–52.0)
Hemoglobin: 15.3 g/dL (ref 13.0–17.0)
MCH: 31.3 pg (ref 26.0–34.0)
MCHC: 34.8 g/dL (ref 30.0–36.0)
MCV: 90 fL (ref 80.0–100.0)
Platelets: 261 10*3/uL (ref 150–400)
RBC: 4.89 MIL/uL (ref 4.22–5.81)
RDW: 12.9 % (ref 11.5–15.5)
WBC: 12 10*3/uL — ABNORMAL HIGH (ref 4.0–10.5)
nRBC: 0 % (ref 0.0–0.2)

## 2019-12-04 LAB — URINALYSIS, ROUTINE W REFLEX MICROSCOPIC
Bilirubin Urine: NEGATIVE
Glucose, UA: >=500 mg/dL — AB
Ketones, ur: 20 mg/dL — AB
Leukocytes,Ua: NEGATIVE
Nitrite: NEGATIVE
Protein, ur: NEGATIVE mg/dL
Specific Gravity, Urine: 1.012 (ref 1.005–1.030)
pH: 5 (ref 5.0–8.0)

## 2019-12-04 LAB — LIPASE, BLOOD: Lipase: 22 U/L (ref 11–51)

## 2019-12-04 MED ORDER — SODIUM CHLORIDE 0.9% FLUSH: 3.0000 mL | Freq: Once | INTRAVENOUS | Status: DC

## 2019-12-04 MED ORDER — DOCUSATE SODIUM 100 MG PO CAPS: 100.0000 mg | ORAL_CAPSULE | Freq: Two times a day (BID) | ORAL | 0 refills | Status: AC

## 2019-12-04 NOTE — Discharge Instructions (Signed)
Take the prescribed medication as directed.  Try to increase fiber to see if this will help regulate bowels. Follow-up with your primary care doctor. Return to the ED for new or worsening symptoms.

## 2019-12-04 NOTE — ED Provider Notes (Signed)
[facility]-EMERGENCY DEPT Provider Note   CSN: [id number] Arrival date & time: 12/04/19  0108     History Chief Complaint  Patient presents with  . Abdominal Pain    Jonathan Henson is a 59 y.o. male.  The history is provided by the patient and medical records.  Abdominal Pain Associated symptoms: constipation     59 year old male with history of hypertension and diabetes, presenting to the ED with difficulty with bowel movements.  States he had similar symptoms over the weekend, ultimately was able to have a bowel movement on Sunday and Monday and began feeling better.  States yesterday pain began to recur.  He reports generalized sensation of fullness and pressure in his abdomen.  He denies any nausea or vomiting.  No difficulty with urination.  States he feels the urge to have a bowel movement but unable to do so.  He has not attempted any stool softeners or laxatives.  No history of constipation in the past.  He is not on any current narcotic medication.  No prior abdominal surgeries.  Past Medical History:  Diagnosis Date  . Diabetes mellitus without complication (HCC)   . Hypertension     There are no problems to display for this patient.   Past Surgical History:  Procedure Laterality Date  . ANTERIOR CERVICAL DECOMP/DISCECTOMY FUSION N/A 01/08/2013   Procedure: ANTERIOR CERVICAL DECOMPRESSION/DISCECTOMY FUSION 1 LEVEL Cervical four-five;  Surgeon: Mariam Dollar, MD;  Location: [facility];  Service: Neurosurgery;  Laterality: N/A;  . NO PAST SURGERIES         History reviewed. No pertinent family history.  Social History   Tobacco Use  . Smoking status: Former Smoker    Years: 5.00    Types: Cigarettes  . Smokeless tobacco: Current User    Types: Snuff  Substance Use Topics  . Alcohol use: No    Comment:  1- 2 drinks a month  . Drug use: No    Home Medications Prior to Admission medications   Medication Sig Start Date End Date  Taking? Authorizing Provider  acetaminophen (TYLENOL) 500 MG tablet Take 500 mg by mouth every 6 (six) hours as needed for pain.    [provider]  cyclobenzaprine (FLEXERIL) 10 MG tablet Take 1 tablet (10 mg total) by mouth 3 (three) times daily as needed for muscle spasms. 01/09/13   Donalee Citrin, MD  glimepiride (AMARYL) 1 MG tablet Take 1 mg by mouth daily before breakfast.    [provider]  lisinopril (PRINIVIL,ZESTRIL) 20 MG tablet Take 20 mg by mouth daily.    [provider]  metFORMIN (GLUCOPHAGE) 500 MG tablet Take 1,000 mg by mouth daily.    [provider]  oxyCODONE-acetaminophen (PERCOCET/ROXICET) 5-325 MG per tablet Take 1-2 tablets by mouth every 4 (four) hours as needed. 01/09/13   Donalee Citrin, MD    Allergies    Patient has no known allergies.  Review of Systems   Review of Systems  Gastrointestinal: Positive for abdominal pain and constipation.  All other systems reviewed and are negative.   Physical Exam Updated Vital Signs BP (!) 151/92 (BP Location: Left Arm)   Pulse 85   Temp 99 F (37.2 C) (Oral)   Resp 15   Ht 5\' 7"  (1.702 m)   Wt 79.4 kg   SpO2 97%   BMI 27.41 kg/m   Physical Exam Vitals and nursing note reviewed.  Constitutional:      Appearance: He  is well-developed.  HENT:     Head: Normocephalic and atraumatic.  Eyes:     Conjunctiva/sclera: Conjunctivae normal.     Pupils: Pupils are equal, round, and reactive to light.  Cardiovascular:     Rate and Rhythm: Normal rate and regular rhythm.     Heart sounds: Normal heart sounds.  Pulmonary:     Effort: Pulmonary effort is normal.     Breath sounds: Normal breath sounds.  Abdominal:     General: Bowel sounds are normal.     Palpations: Abdomen is soft.     Tenderness: There is no abdominal tenderness. There is no guarding or rebound.     Comments: Soft, nontender, no apparent distention, normal bowel sounds  Musculoskeletal:        General: Normal range  of motion.     Cervical back: Normal range of motion.  Skin:    General: Skin is warm and dry.  Neurological:     Mental Status: He is alert and oriented to person, place, and time.     ED Results / Procedures / Treatments   Labs (all labs ordered are listed, but only abnormal results are displayed) Labs Reviewed  COMPREHENSIVE METABOLIC PANEL - Abnormal; Notable for the following components:      Result Value   Sodium 132 (*)    Chloride 94 (*)    Glucose, Bld 208 (*)    All other components within normal limits  CBC - Abnormal; Notable for the following components:   WBC 12.0 (*)    All other components within normal limits  URINALYSIS, ROUTINE W REFLEX MICROSCOPIC - Abnormal; Notable for the following components:   Glucose, UA >=500 (*)    Hgb urine dipstick SMALL (*)    Ketones, ur 20 (*)    Bacteria, UA RARE (*)    All other components within normal limits  LIPASE, BLOOD    EKG None  Radiology DG ABD ACUTE 2+V W 1V CHEST  Result Date: 12/04/2019 CLINICAL DATA:  Constipation, bloating for 3 days EXAM: DG ABDOMEN ACUTE W/ 1V CHEST COMPARISON:  06/02/2013 FINDINGS: Supine and upright frontal views of the abdomen as well as an upright frontal view of the chest are obtained. Cardiac silhouette is unremarkable. The lungs are clear. No effusion or pneumothorax. Bowel gas pattern is unremarkable without obstruction or ileus. No masses or abnormal calcifications. No free gas in the greater peritoneal sac. No acute bony abnormalities. IMPRESSION: 1. Unremarkable bowel gas pattern. 2. No acute intrathoracic process. Electronically Signed   By: Randa Ngo M.D.   On: 12/04/2019 03:57    Procedures Procedures (including critical care time)  Medications Ordered in ED Medications  sodium chloride flush (NS) 0.9 % injection 3 mL (has no administration in time range)    ED Course  I have reviewed the triage vital signs and the nursing notes.  Pertinent labs & imaging results  that were available during my care of the patient were reviewed by me and considered in my medical decision making (see chart for details).    MDM Rules/Calculators/A&P  59 year old male presenting to the ED with feeling of constipation and bloating.    Had similar symptoms over the weekend that resolved after having bowel movement.  States he has not had a bowel movement since Monday and has had return of symptoms.  Feels the urge to have a bowel movement but unable to go.  He is afebrile nontoxic in appearance here.  His abdomen is  soft and benign without any focal tenderness or peritoneal signs.  He does not have any apparent distention and bowel sounds are normal.  Screening labs are reassuring.  Acute abdominal series obtained, normal gas bowel pattern.  Patient without vomiting or other obstructive symptoms.  He is passing flatus and appears very comfortable on exam.  No prior abdominal surgeries, not clinically high risk for SBO.  Do not feel he needs emergent CT at this time.  Will start trial of colace and high fiber diet to see if BM's will regulate.  Can follow-up with PCP as an OP.  Return here for any new/acute changes.  Final Clinical Impression(s) / ED Diagnoses Final diagnoses:  Change in bowel habits    Rx / DC Orders ED Discharge Orders         Ordered    docusate sodium (COLACE) 100 MG capsule  Every 12 hours     12/04/19 0435           Garlon Hatchet, PA-C 12/04/19 0457    Ward, Layla Maw, DO 12/04/19 [phone]

## 2019-12-04 NOTE — ED Triage Notes (Signed)
Pt complains of abdominal pain and distention for two days, pt states that he hasn't had a BM since Tuesday Pt complains of being nauseated but denies any vomiting

## 2021-07-23 IMAGING — CR DG ABDOMEN ACUTE W/ 1V CHEST
4 series · 4 of 4 positions shown · non-contrast
Comparison: 06/02/2013

CLINICAL DATA: Constipation, bloating for 3 days

EXAM:
DG ABDOMEN ACUTE W/ 1V CHEST

[w chest pa]
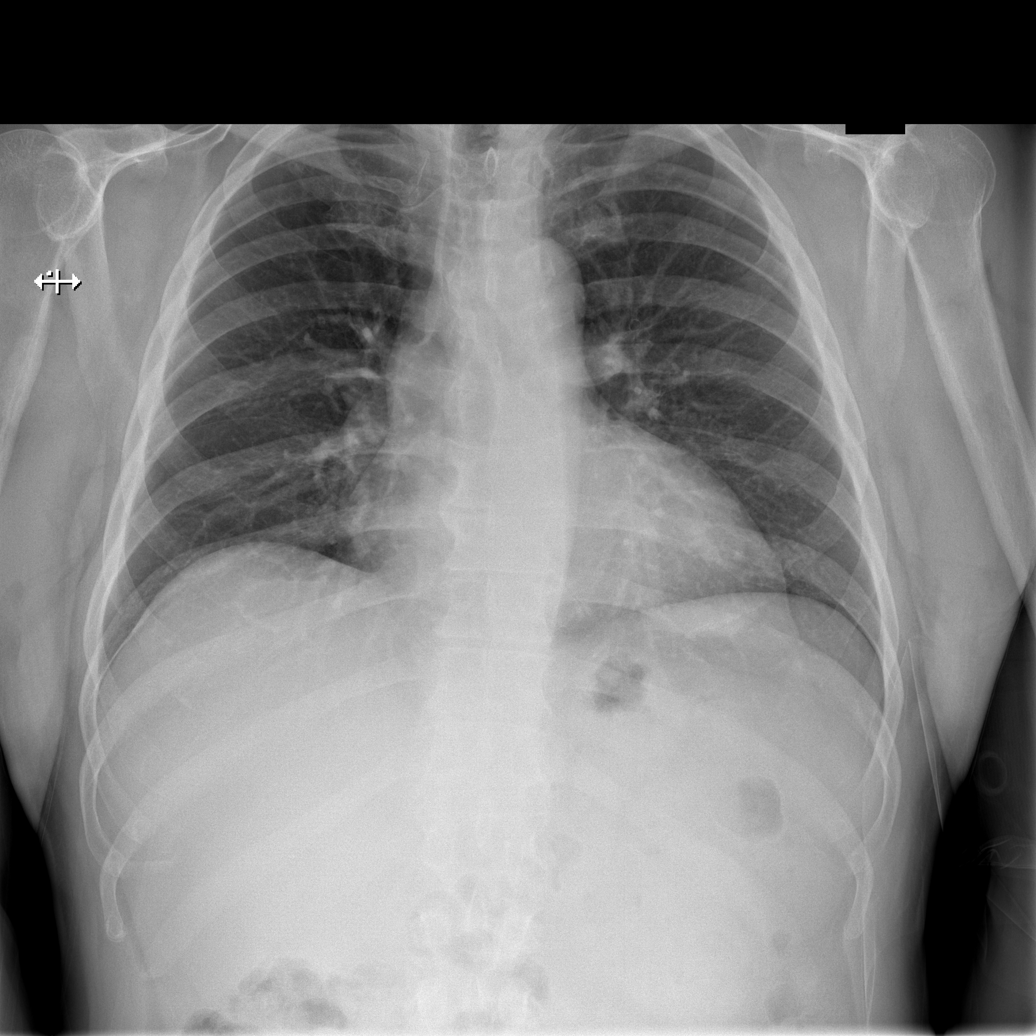

[w abdomen upright]
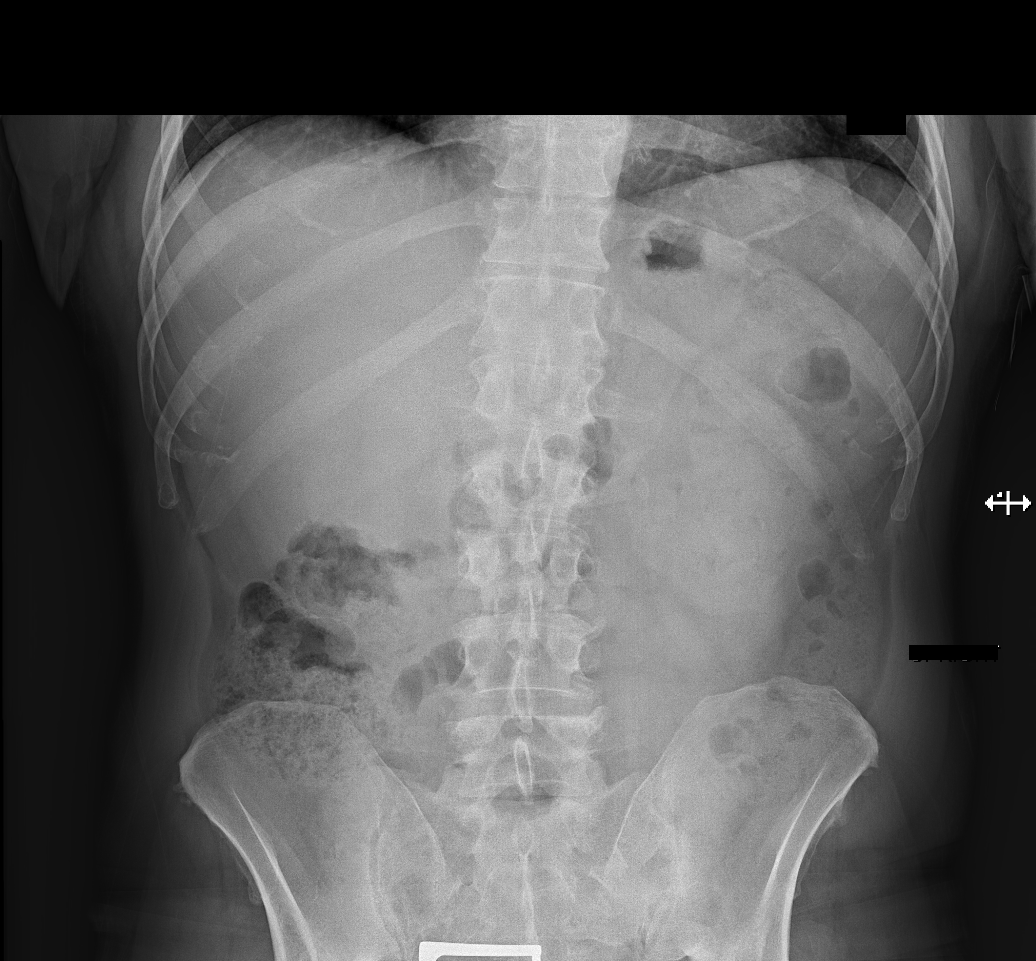

[t abdomen supine (1 of 2)]
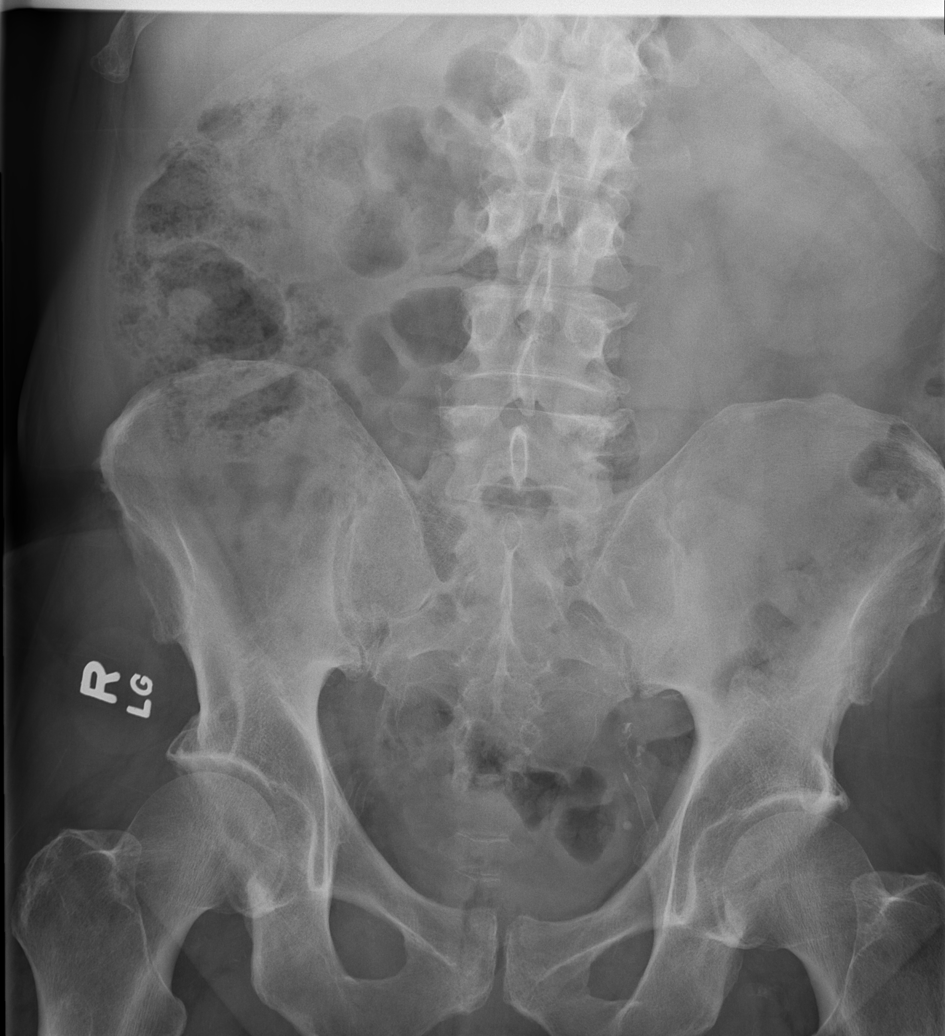

[t abdomen supine (2 of 2)]
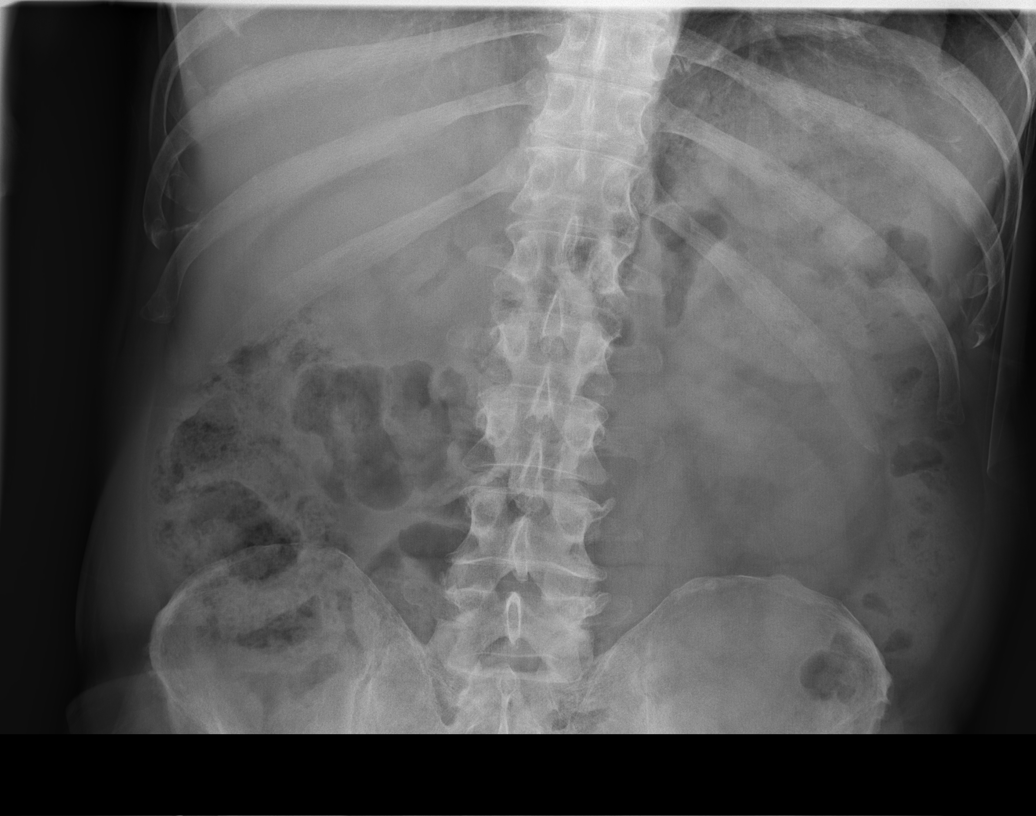

[4 of 4 positions shown; findings below may reference images not displayed]

FINDINGS: Supine and upright frontal views of the abdomen as well as an
upright frontal view of the chest are obtained. Cardiac silhouette
is unremarkable. The lungs are clear. No effusion or pneumothorax.

Bowel gas pattern is unremarkable without obstruction or ileus. No
masses or abnormal calcifications. No free gas in the greater
peritoneal sac. No acute bony abnormalities.
IMPRESSION: 1. Unremarkable bowel gas pattern.
2. No acute intrathoracic process.
